# Patient Record
Sex: Male | Born: 1980 | Race: Black or African American | Hispanic: No | Marital: Single | Smoking: Current some day smoker
Health system: Southern US, Community
[De-identification: ages and names within clinical notes are randomized; demographics above are authoritative.]

## PROBLEM LIST (undated history)

## (undated) DIAGNOSIS — I10 Essential (primary) hypertension: Secondary | ICD-10-CM

## (undated) HISTORY — PX: TONSILLECTOMY: SUR1361

---
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
## 2002-03-21 ENCOUNTER — Encounter: Payer: Self-pay | Admitting: Emergency Medicine

## 2002-03-21 ENCOUNTER — Emergency Department (HOSPITAL_COMMUNITY): Admission: EM | Admit: 2002-03-21 | Discharge: 2002-03-21 | Payer: Self-pay | Admitting: Emergency Medicine

## 2004-03-31 ENCOUNTER — Emergency Department (HOSPITAL_COMMUNITY): Admission: EM | Admit: 2004-03-31 | Discharge: 2004-03-31 | Payer: Self-pay | Admitting: Emergency Medicine

## 2009-01-07 ENCOUNTER — Emergency Department (HOSPITAL_COMMUNITY): Admission: EM | Admit: 2009-01-07 | Discharge: 2009-01-07 | Payer: Self-pay | Admitting: Emergency Medicine

## 2009-04-13 ENCOUNTER — Emergency Department (HOSPITAL_COMMUNITY): Admission: EM | Admit: 2009-04-13 | Discharge: 2009-04-13 | Payer: Self-pay | Admitting: Emergency Medicine

## 2009-05-21 ENCOUNTER — Emergency Department (HOSPITAL_COMMUNITY): Admission: EM | Admit: 2009-05-21 | Discharge: 2009-05-21 | Payer: Self-pay | Admitting: Internal Medicine

## 2009-07-11 ENCOUNTER — Emergency Department (HOSPITAL_COMMUNITY): Admission: EM | Admit: 2009-07-11 | Discharge: 2009-07-11 | Payer: Self-pay | Admitting: Emergency Medicine

## 2009-08-29 ENCOUNTER — Emergency Department (HOSPITAL_COMMUNITY): Admission: EM | Admit: 2009-08-29 | Discharge: 2009-08-29 | Payer: Self-pay | Admitting: Emergency Medicine

## 2009-12-10 ENCOUNTER — Emergency Department (HOSPITAL_COMMUNITY): Admission: EM | Admit: 2009-12-10 | Discharge: 2009-12-10 | Payer: Self-pay | Admitting: Emergency Medicine

## 2010-02-06 ENCOUNTER — Emergency Department (HOSPITAL_COMMUNITY): Admission: EM | Admit: 2010-02-06 | Discharge: 2010-02-06 | Payer: Self-pay | Admitting: Emergency Medicine

## 2010-02-10 ENCOUNTER — Emergency Department (HOSPITAL_COMMUNITY): Admission: EM | Admit: 2010-02-10 | Discharge: 2010-02-10 | Payer: Self-pay | Admitting: Emergency Medicine

## 2010-03-14 ENCOUNTER — Emergency Department (HOSPITAL_COMMUNITY): Admission: EM | Admit: 2010-03-14 | Discharge: 2010-03-15 | Payer: Self-pay | Admitting: Emergency Medicine

## 2010-03-15 ENCOUNTER — Inpatient Hospital Stay (HOSPITAL_COMMUNITY): Admission: AD | Admit: 2010-03-15 | Discharge: 2010-03-16 | Payer: Self-pay | Admitting: Psychiatry

## 2010-03-15 ENCOUNTER — Ambulatory Visit: Payer: Self-pay | Admitting: Psychiatry

## 2011-02-22 LAB — URINALYSIS, ROUTINE W REFLEX MICROSCOPIC
Bilirubin Urine: NEGATIVE
Glucose, UA: NEGATIVE mg/dL
Hgb urine dipstick: NEGATIVE
Ketones, ur: NEGATIVE mg/dL
Nitrite: NEGATIVE
Protein, ur: NEGATIVE mg/dL
Specific Gravity, Urine: 1.025 (ref 1.005–1.030)
Urobilinogen, UA: 1 mg/dL (ref 0.0–1.0)
pH: 8 (ref 5.0–8.0)

## 2011-02-25 LAB — BASIC METABOLIC PANEL
BUN: 5 mg/dL — ABNORMAL LOW (ref 6–23)
CO2: 22 mEq/L (ref 19–32)
Calcium: 9.7 mg/dL (ref 8.4–10.5)
Chloride: 104 mEq/L (ref 96–112)
Creatinine, Ser: 1.36 mg/dL (ref 0.4–1.5)
GFR calc Af Amer: >60 mL/min (ref >60)
GFR calc non Af Amer: >60 mL/min (ref >60)
Glucose, Bld: 107 mg/dL — ABNORMAL HIGH (ref 70–99)
Potassium: 3.4 mEq/L — ABNORMAL LOW (ref 3.5–5.1)
Sodium: 141 mEq/L (ref 135–145)

## 2011-02-25 LAB — CBC
HCT: 48.9 % (ref 39.0–52.0)
Hemoglobin: 17 g/dL (ref 13.0–17.0)
MCHC: 34.8 g/dL (ref 30.0–36.0)
MCV: 79.5 fL (ref 78.0–100.0)
Platelets: 294 10*3/uL (ref 150–400)
RBC: 6.15 MIL/uL — ABNORMAL HIGH (ref 4.22–5.81)
RDW: 13.5 % (ref 11.5–15.5)
WBC: 14.9 10*3/uL — ABNORMAL HIGH (ref 4.0–10.5)

## 2011-02-25 LAB — RAPID URINE DRUG SCREEN, HOSP PERFORMED
Amphetamines: NOT DETECTED
Barbiturates: NOT DETECTED
Benzodiazepines: NOT DETECTED
Cocaine: POSITIVE — AB
Opiates: NOT DETECTED
Tetrahydrocannabinol: POSITIVE — AB

## 2011-02-25 LAB — ETHANOL: Alcohol, Ethyl (B): 187 mg/dL — ABNORMAL HIGH (ref 0–10)

## 2011-02-25 LAB — DIFFERENTIAL
Basophils Absolute: 0 10*3/uL (ref 0.0–0.1)
Basophils Relative: 0 % (ref 0–1)
Eosinophils Relative: 0 % (ref 0–5)
Monocytes Absolute: 0.8 10*3/uL (ref 0.1–1.0)

## 2012-08-19 ENCOUNTER — Emergency Department (HOSPITAL_COMMUNITY)
Admission: EM | Admit: 2012-08-19 | Discharge: 2012-08-19 | Disposition: A | Discharge status: Home / Self Care | Payer: Self-pay | Source: Home / Self Care | Attending: Emergency Medicine | Admitting: Emergency Medicine

## 2012-08-19 ENCOUNTER — Encounter (HOSPITAL_COMMUNITY): Payer: Self-pay | Admitting: Emergency Medicine

## 2012-08-19 DIAGNOSIS — Z202 Contact with and (suspected) exposure to infections with a predominantly sexual mode of transmission: Secondary | ICD-10-CM

## 2012-08-19 DIAGNOSIS — Z2089 Contact with and (suspected) exposure to other communicable diseases: Secondary | ICD-10-CM

## 2012-08-19 MED ORDER — AZITHROMYCIN 1 G PO PACK
1.0000 g | PACK | Freq: Once | ORAL | Status: AC
  Administered 2012-08-19: 1 g via ORAL

## 2012-08-19 MED ORDER — LIDOCAINE HCL (PF) 1 % IJ SOLN
INTRAMUSCULAR | Status: AC
  Filled 2012-08-19: qty 5

## 2012-08-19 MED ORDER — METRONIDAZOLE 500 MG PO TABS: 2000.0000 mg | ORAL_TABLET | Freq: Once | ORAL | Status: AC

## 2012-08-19 MED ORDER — CEFTRIAXONE SODIUM 250 MG IJ SOLR
INTRAMUSCULAR | Status: AC
  Filled 2012-08-19: qty 250

## 2012-08-19 MED ORDER — CEFTRIAXONE SODIUM 250 MG IJ SOLR
250.0000 mg | Freq: Once | INTRAMUSCULAR | Status: AC
  Administered 2012-08-19: 250 mg via INTRAMUSCULAR

## 2012-08-19 MED ORDER — AZITHROMYCIN 250 MG PO TABS
ORAL_TABLET | ORAL | Status: AC
  Filled 2012-08-19: qty 4

## 2012-08-19 NOTE — ED Notes (Signed)
States girlfriend dx with gonorrhea c/o of yellow penile d/c x 3 days

## 2012-08-19 NOTE — ED Provider Notes (Signed)
History     CSN: [id number]  Arrival date & time 08/19/12  1722   First MD Initiated Contact with Patient 08/19/12 1723      No chief complaint on file.   (Consider location/radiation/quality/duration/timing/severity/associated sxs/prior treatment) The history is provided by the patient.   31 y.o. male complains of yellow penis discharge for 2 days.  Denies significant pelvic pain or fever. No UTI symptoms. Sexually active, sometimes uses condoms, new partner.  Last unprotected intercourse 4 days ago, condom broke.  Reports partner reported +gonorrhea after being tested, +chlamydia hx 4 years ago.    History reviewed. No pertinent past medical history.  History reviewed. No pertinent past surgical history.  No family history on file.  History  Substance Use Topics  . Smoking status: Not on file  . Smokeless tobacco: Not on file  . Alcohol Use: Not on file      Review of Systems  Constitutional: Negative.   Respiratory: Negative.   Cardiovascular: Negative.   Genitourinary: Positive for discharge. Negative for dysuria, flank pain, decreased urine volume, scrotal swelling, penile pain and testicular pain.    Allergies  Review of patient's allergies indicates no known allergies.  Home Medications   Current Outpatient Rx  Name Route Sig Dispense Refill  . METRONIDAZOLE 500 MG PO TABS Oral Take 4 tablets (2,000 mg total) by mouth once. 4 tablet 0    BP 116/79  Pulse 68  Temp 97.8 F (36.6 C) (Oral)  Resp 16  SpO2 99%  Physical Exam  Nursing note and vitals reviewed. Constitutional: He is oriented to person, place, and time. Vital signs are normal. He appears well-developed and well-nourished. He is active and cooperative.  HENT:  Head: Normocephalic.  Eyes: Conjunctivae normal are normal. Pupils are equal, round, and reactive to light. No scleral icterus.  Neck: Trachea normal. Neck supple.  Cardiovascular: Normal rate, regular rhythm and normal heart sounds.    Pulmonary/Chest: Effort normal and breath sounds normal.  Abdominal: Soft. Bowel sounds are normal. There is no tenderness.  Genitourinary: Testes normal and penis normal. Cremasteric reflex is present. Circumcised.  Lymphadenopathy:    He has no cervical adenopathy.    He has no axillary adenopathy.       Right: No inguinal adenopathy present.       Left: No inguinal adenopathy present.  Neurological: He is alert and oriented to person, place, and time. No cranial nerve deficit or sensory deficit.  Skin: Skin is warm and dry.  Psychiatric: He has a normal mood and affect. His speech is normal and behavior is normal. Judgment and thought content normal. Cognition and memory are normal.    ED Course  Procedures (including critical care time)  Labs Reviewed - No data to display No results found.   1. Exposure to STD       MDM  Sent off GC/chlamydia.  Will treat empirically now. Giving ceftriaxone 250 mg IM/azithro 1 gm po. Will send home with flagyl.  Advised pt to refrain from sexual contact until she he knows lab results, symptoms resolve, and partner(s) are treated. Pt provided working phone number. Pt agrees.          Johnsie Kindred, NP 08/20/12 2038

## 2012-08-20 LAB — GC/CHLAMYDIA PROBE AMP, GENITAL: Chlamydia, DNA Probe: POSITIVE — AB

## 2012-08-22 ENCOUNTER — Telehealth (HOSPITAL_COMMUNITY): Payer: Self-pay | Admitting: *Deleted

## 2012-08-22 NOTE — ED Notes (Signed)
GC neg., Chlamydia pos.  Pt. adequately treated with Zithromax. I called and left a message to call.  DHHS form completed and faxed to the [facility]. Vassie Moselle 08/22/2012

## 2012-08-24 NOTE — ED Provider Notes (Signed)
Medical screening examination/treatment/procedure(s) were performed by non-physician practitioner and as supervising physician I was immediately available for consultation/collaboration.  Leslee Home, M.D.   Reuben Likes, MD 08/24/12 [phone]

## 2012-08-25 ENCOUNTER — Telehealth (HOSPITAL_COMMUNITY): Payer: Self-pay | Admitting: *Deleted

## 2012-08-26 ENCOUNTER — Telehealth (HOSPITAL_COMMUNITY): Payer: Self-pay | Admitting: *Deleted

## 2012-08-26 NOTE — ED Notes (Signed)
I called home number and it is not in service. I called cell number and left a message.  Call 3.  Unable to contact pt. by phone.  Letter sent.

## 2012-10-10 ENCOUNTER — Emergency Department (HOSPITAL_COMMUNITY)
Admission: EM | Admit: 2012-10-10 | Discharge: 2012-10-10 | Discharge status: Against Medical Advice | Payer: Medicaid Other | Attending: Emergency Medicine | Admitting: Emergency Medicine

## 2012-10-10 ENCOUNTER — Encounter (HOSPITAL_COMMUNITY): Payer: Self-pay | Admitting: Emergency Medicine

## 2012-10-10 DIAGNOSIS — L0201 Cutaneous abscess of face: Secondary | ICD-10-CM | POA: Insufficient documentation

## 2012-10-10 DIAGNOSIS — L03211 Cellulitis of face: Secondary | ICD-10-CM | POA: Insufficient documentation

## 2012-10-10 DIAGNOSIS — F17200 Nicotine dependence, unspecified, uncomplicated: Secondary | ICD-10-CM | POA: Insufficient documentation

## 2012-10-10 MED ORDER — LIDOCAINE HCL 2 % IJ SOLN: 10.0000 mL | Freq: Once | INTRAMUSCULAR | Status: DC

## 2012-10-10 NOTE — ED Notes (Signed)
No response from the patient.

## 2012-10-10 NOTE — ED Notes (Signed)
Pt called x3 attempts - unable to locate pt.

## 2012-10-10 NOTE — ED Notes (Signed)
Pt presenting to ed with c/o abscess to forehead pt states sometimes I feel hot and sometimes I feel cold pt with redness and swelling noted to face

## 2012-10-11 ENCOUNTER — Emergency Department (HOSPITAL_COMMUNITY)
Admission: EM | Admit: 2012-10-11 | Discharge: 2012-10-11 | Disposition: A | Discharge status: Home / Self Care | Payer: Medicaid Other | Attending: Emergency Medicine | Admitting: Emergency Medicine

## 2012-10-11 ENCOUNTER — Encounter (HOSPITAL_COMMUNITY): Payer: Self-pay | Admitting: Emergency Medicine

## 2012-10-11 DIAGNOSIS — L0201 Cutaneous abscess of face: Secondary | ICD-10-CM | POA: Insufficient documentation

## 2012-10-11 DIAGNOSIS — F17200 Nicotine dependence, unspecified, uncomplicated: Secondary | ICD-10-CM | POA: Insufficient documentation

## 2012-10-11 DIAGNOSIS — L03211 Cellulitis of face: Secondary | ICD-10-CM | POA: Insufficient documentation

## 2012-10-11 MED ORDER — SULFAMETHOXAZOLE-TMP DS 800-160 MG PO TABS: 1.0000 | ORAL_TABLET | Freq: Two times a day (BID) | ORAL | Status: DC

## 2012-10-11 MED ORDER — SULFAMETHOXAZOLE-TMP DS 800-160 MG PO TABS
1.0000 | ORAL_TABLET | Freq: Once | ORAL | Status: AC
  Administered 2012-10-11: 1 via ORAL
  Filled 2012-10-11: qty 1

## 2012-10-11 NOTE — ED Notes (Signed)
The patient is AOx4 and comfortable with the discharge instructions. 

## 2012-10-11 NOTE — ED Notes (Signed)
Pt reports for about a week, having abscess to forehead; pt reports has gotten bigger over the time and is painful

## 2012-10-11 NOTE — ED Provider Notes (Signed)
Medical screening examination/treatment/procedure(s) were performed by non-physician practitioner and as supervising physician I was immediately available for consultation/collaboration.    Vida Roller, MD 10/11/12 [phone]

## 2012-10-11 NOTE — ED Provider Notes (Signed)
History     CSN: [id number]  Arrival date & time 10/11/12  0018   First MD Initiated Contact with Patient 10/11/12 0246      Chief Complaint  Patient presents with  . Abscess    (Consider location/radiation/quality/duration/timing/severity/associated sxs/prior treatment) HPI Comments: Patient has a small, firm, nodule between his eyebrows, causing pain to his nose, and swelling.  This is been going on for approximately one week.  It is tender to touch.  He denies any visual disturbances, fevers, myalgias  Patient is a 31 y.o. male presenting with abscess. The history is provided by the patient.  Abscess  This is a new problem. The current episode started less than one week ago. The problem occurs continuously. Pertinent negatives include no fever and no rhinorrhea.    History reviewed. No pertinent past medical history.  Past Surgical History  Procedure Date  . Tonsillectomy     History reviewed. No pertinent family history.  History  Substance Use Topics  . Smoking status: Current Some Day Smoker    Types: Cigarettes  . Smokeless tobacco: Not on file  . Alcohol Use: No      Review of Systems  Constitutional: Negative for fever and chills.  HENT: Negative for rhinorrhea.   Eyes: Negative for visual disturbance.  Skin: Negative for wound.  Neurological: Negative for weakness and headaches.    Allergies  Penicillins  Home Medications   Current Outpatient Rx  Name  Route  Sig  Dispense  Refill  . SULFAMETHOXAZOLE-TMP DS 800-160 MG PO TABS   Oral   Take 1 tablet by mouth 2 (two) times daily.   9 tablet   0     BP 162/90  Pulse 94  Temp 98.7 F (37.1 C) (Oral)  Resp 18  SpO2 97%  Physical Exam  Constitutional: He appears well-developed and well-nourished.  HENT:  Head: Normocephalic.  Eyes: Pupils are equal, round, and reactive to light.  Neck: Normal range of motion.  Pulmonary/Chest: Effort normal.  Musculoskeletal: Normal range of motion.    Neurological: He is alert.  Skin: Skin is warm. There is erythema.       ED Course  INCISION AND DRAINAGE Date/Time: 10/11/2012 3:11 AM Performed by: Arman Filter Authorized by: Arman Filter Consent: Verbal consent obtained. Risks and benefits: risks, benefits and alternatives were discussed Consent given by: patient Patient understanding: patient states understanding of the procedure being performed Required items: required blood products, implants, devices, and special equipment available Patient identity confirmed: verbally with patient Time out: Immediately prior to procedure a "time out" was called to verify the correct patient, procedure, equipment, support staff and site/side marked as required. Type: abscess Body area: head/neck Location details: face Anesthesia: local infiltration Local anesthetic: lidocaine 1% without epinephrine Scalpel size: 11 Needle gauge: 22 Incision type: single straight Complexity: simple Drainage: purulent Drainage amount: scant Wound treatment: wound left open Patient tolerance: Patient tolerated the procedure well with no immediate complications.   (including critical care time)  Labs Reviewed - No data to display No results found.   1. Facial abscess       MDM   Will start patient on Bactrim, as well as the I&D and warm compresses        Arman Filter, NP 10/11/12 [phone]

## 2012-10-11 NOTE — ED Notes (Signed)
Abscess to forehead between eyes; hard to touch; no drainage noted; denies any visual disturbances

## 2012-11-14 ENCOUNTER — Emergency Department (HOSPITAL_COMMUNITY)
Admission: EM | Admit: 2012-11-14 | Discharge: 2012-11-14 | Disposition: A | Discharge status: Home / Self Care | Payer: Medicaid Other | Attending: Emergency Medicine | Admitting: Emergency Medicine

## 2012-11-14 ENCOUNTER — Encounter (HOSPITAL_COMMUNITY): Payer: Self-pay | Admitting: Emergency Medicine

## 2012-11-14 DIAGNOSIS — A749 Chlamydial infection, unspecified: Secondary | ICD-10-CM | POA: Insufficient documentation

## 2012-11-14 DIAGNOSIS — A5400 Gonococcal infection of lower genitourinary tract, unspecified: Secondary | ICD-10-CM | POA: Insufficient documentation

## 2012-11-14 DIAGNOSIS — Z8619 Personal history of other infectious and parasitic diseases: Secondary | ICD-10-CM | POA: Insufficient documentation

## 2012-11-14 DIAGNOSIS — R369 Urethral discharge, unspecified: Secondary | ICD-10-CM

## 2012-11-14 DIAGNOSIS — F17200 Nicotine dependence, unspecified, uncomplicated: Secondary | ICD-10-CM | POA: Insufficient documentation

## 2012-11-14 DIAGNOSIS — A64 Unspecified sexually transmitted disease: Secondary | ICD-10-CM

## 2012-11-14 DIAGNOSIS — Z880 Allergy status to penicillin: Secondary | ICD-10-CM | POA: Insufficient documentation

## 2012-11-14 MED ORDER — AZITHROMYCIN 1 G PO PACK
1.0000 g | PACK | Freq: Once | ORAL | Status: AC
  Administered 2012-11-14: 1 g via ORAL
  Filled 2012-11-14: qty 1

## 2012-11-14 MED ORDER — CEFTRIAXONE SODIUM 250 MG IJ SOLR
250.0000 mg | Freq: Once | INTRAMUSCULAR | Status: AC
  Administered 2012-11-14: 250 mg via INTRAMUSCULAR
  Filled 2012-11-14: qty 250

## 2012-11-14 MED ORDER — DIPHENHYDRAMINE HCL 25 MG PO CAPS
25.0000 mg | ORAL_CAPSULE | Freq: Once | ORAL | Status: AC
  Administered 2012-11-14: 25 mg via ORAL
  Filled 2012-11-14: qty 1
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
## 2012-11-14 NOTE — ED Provider Notes (Signed)
History   This chart was scribed for Tommy Skene, MD by Tommy Bond, ED Scribe. This patient was seen in room TR11C/TR11C and the patient's care was started at 12:50PM.   CSN: [id number]  Arrival date & time 11/14/12  1216   None     No chief complaint on file.    The history is provided by the patient. No language interpreter was used.  Tommy Bond is a 31 y.o. male who presents to the Emergency Department complaining of exposure to gonorrhea 3 days ago. Pt reports having associated penile discharge, tingling while urinating it is mild. He denies any chest pain, SOB, cough, runny nose, testicular pain, abdominal pain, vomiting, diarrhea, nausea, muscle aches.    Pt has h/o chlamydia.  Pt is a current everyday smoker but denies alcohol use. History reviewed. No pertinent past medical history.  Past Surgical History  Procedure Date  . Tonsillectomy     No family history on file.  History  Substance Use Topics  . Smoking status: Current Some Day Smoker    Types: Cigarettes  . Smokeless tobacco: Not on file  . Alcohol Use: No      Review of Systems At least 10pt or greater review of systems completed and are negative except where specified in the HPI.  Allergies  Penicillins  Home Medications   Current Outpatient Rx  Name  Route  Sig  Dispense  Refill  . SULFAMETHOXAZOLE-TMP DS 800-160 MG PO TABS   Oral   Take 1 tablet by mouth 2 (two) times daily.   9 tablet   0     BP 143/91  Pulse 76  Temp 97.9 F (36.6 C) (Oral)  Resp 18  SpO2 99%  Physical Exam  Nursing notes reviewed.  Electronic medical record reviewed. VITAL SIGNS:   Filed Vitals:   11/14/12 1220  BP: 143/91  Pulse: 76  Temp: 97.9 F (36.6 C)  TempSrc: Oral  Resp: 18  SpO2: 99%   CONSTITUTIONAL: Awake, oriented, appears non-toxic HENT: Atraumatic, normocephalic, oral mucosa pink and moist, airway patent. Nares patent without drainage. External ears normal. EYES: Conjunctiva  clear, EOMI, PERRLA NECK: Trachea midline, non-tender, supple CARDIOVASCULAR: Normal heart rate, Normal rhythm, No murmurs, rubs, gallops PULMONARY/CHEST: Clear to auscultation, no rhonchi, wheezes, or rales. Symmetrical breath sounds. Non-tender. ABDOMINAL: Non-distended, soft, non-tender - no rebound or guarding.  BS normal. GU: Normal circumcised male, no lesions, no lymphadenopathy, no hernia present NEUROLOGIC: Non-focal, moving all four extremities, no gross sensory or motor deficits. EXTREMITIES: No clubbing, cyanosis, or edema SKIN: Warm, Dry, No erythema, No rash  ED Course  Procedures (including critical care time)  DIAGNOSTIC STUDIES: Oxygen Saturation is 99% on room air, normal by my interpretation.    COORDINATION OF CARE:  1:08 PM Discussed treatment plan which includes lab work with pt at bedside and pt agreed to plan.   Labs Reviewed - No data to display No results found.   1. Sexually transmitted infection   2. Penile discharge       MDM  Tommy Bond is a 31 y.o. male was exposed to [facility] and is complaining about urethral discharge. Treat patient ceftriaxone and azithromycin. Patient has a penicillin allergy listed for hives-given the low cross-reactivity of third-generation cephalosporins with penicillin, we'll give him the intramuscular injection of Rocephin with a 25 mg then a drill tablet. Patient will followup at the health department for further testing.   I personally performed the services described in this  documentation, which was scribed in my presence. The recorded information has been reviewed and is accurate. Tommy Bond, M.D.      Tommy Skene, MD 11/14/12 1645

## 2012-11-15 LAB — GC/CHLAMYDIA PROBE AMP: CT Probe RNA: POSITIVE — AB

## 2012-11-16 NOTE — ED Notes (Signed)
+   Gonorrhea +Chlamydia Patient treated with rocephin and Zithromax-DHHS letter faxed.
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
## 2014-02-03 ENCOUNTER — Emergency Department: Payer: Self-pay | Admitting: Emergency Medicine

## 2015-03-10 ENCOUNTER — Emergency Department (HOSPITAL_COMMUNITY)
Admission: EM | Admit: 2015-03-10 | Discharge: 2015-03-10 | Disposition: A | Discharge status: Home / Self Care | Payer: Medicaid Other | Attending: Emergency Medicine | Admitting: Emergency Medicine

## 2015-03-10 ENCOUNTER — Emergency Department (HOSPITAL_COMMUNITY): Payer: Medicaid Other

## 2015-03-10 ENCOUNTER — Encounter (HOSPITAL_COMMUNITY): Payer: Self-pay

## 2015-03-10 DIAGNOSIS — S2231XA Fracture of one rib, right side, initial encounter for closed fracture: Secondary | ICD-10-CM | POA: Diagnosis not present

## 2015-03-10 DIAGNOSIS — Y92410 Unspecified street and highway as the place of occurrence of the external cause: Secondary | ICD-10-CM | POA: Insufficient documentation

## 2015-03-10 DIAGNOSIS — S299XXA Unspecified injury of thorax, initial encounter: Secondary | ICD-10-CM | POA: Diagnosis present

## 2015-03-10 DIAGNOSIS — Y9389 Activity, other specified: Secondary | ICD-10-CM | POA: Diagnosis not present

## 2015-03-10 DIAGNOSIS — Y998 Other external cause status: Secondary | ICD-10-CM | POA: Diagnosis not present

## 2015-03-10 MED ORDER — TRAMADOL HCL 50 MG PO TABS: 50.0000 mg | ORAL_TABLET | Freq: Four times a day (QID) | ORAL | Status: DC | PRN

## 2015-03-10 MED ORDER — IBUPROFEN 800 MG PO TABS: 800.0000 mg | ORAL_TABLET | Freq: Three times a day (TID) | ORAL | Status: DC
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
## 2015-04-04 ENCOUNTER — Telehealth (HOSPITAL_BASED_OUTPATIENT_CLINIC_OR_DEPARTMENT_OTHER): Payer: Self-pay | Admitting: Emergency Medicine

## 2015-05-21 ENCOUNTER — Emergency Department (HOSPITAL_COMMUNITY): Payer: Medicaid Other

## 2015-05-21 ENCOUNTER — Emergency Department (HOSPITAL_COMMUNITY)
Admission: EM | Admit: 2015-05-21 | Discharge: 2015-05-21 | Disposition: A | Discharge status: Home / Self Care | Payer: Medicaid Other | Attending: Emergency Medicine | Admitting: Emergency Medicine

## 2015-05-21 ENCOUNTER — Encounter (HOSPITAL_COMMUNITY): Payer: Self-pay | Admitting: *Deleted

## 2015-05-21 DIAGNOSIS — I10 Essential (primary) hypertension: Secondary | ICD-10-CM | POA: Diagnosis not present

## 2015-05-21 DIAGNOSIS — Z880 Allergy status to penicillin: Secondary | ICD-10-CM | POA: Insufficient documentation

## 2015-05-21 DIAGNOSIS — X58XXXS Exposure to other specified factors, sequela: Secondary | ICD-10-CM | POA: Insufficient documentation

## 2015-05-21 DIAGNOSIS — M21822 Other specified acquired deformities of left upper arm: Secondary | ICD-10-CM

## 2015-05-21 DIAGNOSIS — M24412 Recurrent dislocation, left shoulder: Secondary | ICD-10-CM | POA: Insufficient documentation

## 2015-05-21 DIAGNOSIS — Z720 Tobacco use: Secondary | ICD-10-CM | POA: Diagnosis not present

## 2015-05-21 DIAGNOSIS — S43005S Unspecified dislocation of left shoulder joint, sequela: Secondary | ICD-10-CM | POA: Insufficient documentation

## 2015-05-21 DIAGNOSIS — S43492A Other sprain of left shoulder joint, initial encounter: Secondary | ICD-10-CM

## 2015-05-21 DIAGNOSIS — Z791 Long term (current) use of non-steroidal anti-inflammatories (NSAID): Secondary | ICD-10-CM | POA: Insufficient documentation

## 2015-05-21 DIAGNOSIS — S42302S Unspecified fracture of shaft of humerus, left arm, sequela: Secondary | ICD-10-CM | POA: Diagnosis not present

## 2015-05-21 DIAGNOSIS — M25512 Pain in left shoulder: Secondary | ICD-10-CM | POA: Diagnosis present

## 2015-05-21 HISTORY — DX: Essential (primary) hypertension: I10

## 2015-05-21 MED ORDER — OXYCODONE-ACETAMINOPHEN 5-325 MG PO TABS
1.0000 | ORAL_TABLET | Freq: Once | ORAL | Status: AC
  Administered 2015-05-21: 1 via ORAL
  Filled 2015-05-21: qty 1

## 2015-05-21 MED ORDER — OXYCODONE-ACETAMINOPHEN 5-325 MG PO TABS: 1.0000 | ORAL_TABLET | Freq: Once | ORAL | Status: DC
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
## 2015-05-30 ENCOUNTER — Emergency Department (HOSPITAL_COMMUNITY)
Admission: EM | Admit: 2015-05-30 | Discharge: 2015-05-30 | Disposition: A | Discharge status: Home / Self Care | Payer: Medicaid Other | Attending: Emergency Medicine | Admitting: Emergency Medicine

## 2015-05-30 ENCOUNTER — Encounter (HOSPITAL_COMMUNITY): Payer: Self-pay | Admitting: *Deleted

## 2015-05-30 DIAGNOSIS — I10 Essential (primary) hypertension: Secondary | ICD-10-CM | POA: Insufficient documentation

## 2015-05-30 DIAGNOSIS — G8911 Acute pain due to trauma: Secondary | ICD-10-CM | POA: Insufficient documentation

## 2015-05-30 DIAGNOSIS — Z880 Allergy status to penicillin: Secondary | ICD-10-CM | POA: Diagnosis not present

## 2015-05-30 DIAGNOSIS — M25512 Pain in left shoulder: Secondary | ICD-10-CM | POA: Diagnosis not present

## 2015-05-30 DIAGNOSIS — Z720 Tobacco use: Secondary | ICD-10-CM | POA: Insufficient documentation

## 2015-05-30 MED ORDER — CYCLOBENZAPRINE HCL 10 MG PO TABS: 10.0000 mg | ORAL_TABLET | Freq: Two times a day (BID) | ORAL | Status: DC | PRN

## 2015-05-30 MED ORDER — MELOXICAM 15 MG PO TABS: 15.0000 mg | ORAL_TABLET | Freq: Every day | ORAL | Status: DC

## 2015-05-30 NOTE — Discharge Instructions (Signed)
Take Mobic as needed for pain. Take Flexeril as needed for muscle spasm. You may take these medications together.

## 2015-05-30 NOTE — ED Provider Notes (Signed)
CSN: [id number]     Arrival date & time 05/30/15  0114 History   First MD Initiated Contact with Patient 05/30/15 0129     Chief Complaint  Patient presents with  . Shoulder Pain     (Consider location/radiation/quality/duration/timing/severity/associated sxs/prior Treatment) HPI Comments: Patient is a 34 year old male who presents with left shoulder pain from an injury that occurred 1.5 weeks ago. Patient reports continued pain from the incident and is out of his percocet. He is requesting a refill of pain medication. The pain is aching and severe without radiation. Movement and palpation makes the pain worse. No alleviating factors. No new injury. Patient will see an Orthopedist on July 1.    Past Medical History  Diagnosis Date  . Hypertension    Past Surgical History  Procedure Laterality Date  . Tonsillectomy     No family history on file. History  Substance Use Topics  . Smoking status: Current Some Day Smoker    Types: Cigarettes  . Smokeless tobacco: Not on file  . Alcohol Use: No    Review of Systems  Musculoskeletal: Positive for arthralgias.  All other systems reviewed and are negative.     Allergies  Penicillins  Home Medications   Prior to Admission medications   Medication Sig Start Date End Date Taking? Authorizing Provider  naproxen sodium (ANAPROX) 220 MG tablet Take 440-660 mg by mouth 2 (two) times daily as needed (for pain).    Historical Provider, MD  oxyCODONE-acetaminophen (PERCOCET/ROXICET) 5-325 MG per tablet Take 1-2 tablets by mouth once. 05/21/15   Marisa Severin, MD   BP 137/91 mmHg  Pulse 87  Temp(Src) 97.9 F (36.6 C) (Oral)  Resp 20  SpO2 100% Physical Exam  Constitutional: He is oriented to person, place, and time. He appears well-developed and well-nourished. No distress.  HENT:  Head: Normocephalic and atraumatic.  Eyes: Conjunctivae and EOM are normal.  Neck: Normal range of motion.  Cardiovascular: Normal rate and regular  rhythm.  Exam reveals no gallop and no friction rub.   No murmur heard. Pulmonary/Chest: Effort normal and breath sounds normal. He has no wheezes. He has no rales. He exhibits no tenderness.  Abdominal: Soft. He exhibits no distension. There is no tenderness.  Musculoskeletal:  Slightly limited ROM of left shoulder due to pain. No obvious deformity. Generalized tenderness to palpation. Patient has left arm in sling.   Neurological: He is alert and oriented to person, place, and time. Coordination normal.  Speech is goal-oriented. Moves limbs without ataxia.   Skin: Skin is warm and dry.  Psychiatric: He has a normal mood and affect. His behavior is normal.  Nursing note and vitals reviewed.   ED Course  Procedures (including critical care time) Labs Review Labs Reviewed - No data to display  Imaging Review No results found.   EKG Interpretation None      MDM   Final diagnoses:  Left shoulder pain    1:42 AM Patient reports continued pain from previous shoulder injury. Patient requesting medication refill. I will not refill Percocet but I will prescribe Mobic and Flexeril for pain. No new injury. No neurovascular compromise.   Patient has multiple warrants out for felony charges and will be discharged to jail.     Emilia Beck, PA-C 05/30/15 0147  Richardean Canal, MD 05/30/15 [phone]
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
## 2016-01-09 ENCOUNTER — Emergency Department (HOSPITAL_COMMUNITY)
Admission: EM | Admit: 2016-01-09 | Discharge: 2016-01-09 | Disposition: A | Discharge status: Home / Self Care | Payer: Medicaid Other | Attending: Emergency Medicine | Admitting: Emergency Medicine

## 2016-01-09 ENCOUNTER — Encounter (HOSPITAL_COMMUNITY): Payer: Self-pay

## 2016-01-09 DIAGNOSIS — A64 Unspecified sexually transmitted disease: Secondary | ICD-10-CM | POA: Diagnosis not present

## 2016-01-09 DIAGNOSIS — I10 Essential (primary) hypertension: Secondary | ICD-10-CM | POA: Insufficient documentation

## 2016-01-09 DIAGNOSIS — Z880 Allergy status to penicillin: Secondary | ICD-10-CM | POA: Diagnosis not present

## 2016-01-09 DIAGNOSIS — Z791 Long term (current) use of non-steroidal anti-inflammatories (NSAID): Secondary | ICD-10-CM | POA: Insufficient documentation

## 2016-01-09 DIAGNOSIS — F17210 Nicotine dependence, cigarettes, uncomplicated: Secondary | ICD-10-CM | POA: Diagnosis not present

## 2016-01-09 DIAGNOSIS — R369 Urethral discharge, unspecified: Secondary | ICD-10-CM | POA: Diagnosis present

## 2016-01-09 DIAGNOSIS — Z79899 Other long term (current) drug therapy: Secondary | ICD-10-CM | POA: Insufficient documentation

## 2016-01-09 LAB — URINE MICROSCOPIC-ADD ON

## 2016-01-09 LAB — URINALYSIS, ROUTINE W REFLEX MICROSCOPIC
Glucose, UA: NEGATIVE mg/dL
KETONES UR: NEGATIVE mg/dL
NITRITE: NEGATIVE
PH: 5 (ref 5.0–8.0)
PROTEIN: 30 mg/dL — AB
Specific Gravity, Urine: 1.03 (ref 1.005–1.030)

## 2016-01-09 LAB — GC/CHLAMYDIA PROBE AMP (~~LOC~~) NOT AT ARMC
Chlamydia: NEGATIVE
NEISSERIA GONORRHEA: NEGATIVE

## 2016-01-09 LAB — HIV ANTIBODY (ROUTINE TESTING W REFLEX): HIV SCREEN 4TH GENERATION: NONREACTIVE

## 2016-01-09 MED ORDER — LIDOCAINE HCL (PF) 1 % IJ SOLN
INTRAMUSCULAR | Status: AC
  Administered 2016-01-09: 0.9 mL
  Filled 2016-01-09: qty 5

## 2016-01-09 MED ORDER — CEFTRIAXONE SODIUM 250 MG IJ SOLR
250.0000 mg | Freq: Once | INTRAMUSCULAR | Status: AC
  Administered 2016-01-09: 250 mg via INTRAMUSCULAR
  Filled 2016-01-09: qty 250

## 2016-01-09 MED ORDER — AZITHROMYCIN 250 MG PO TABS
1000.0000 mg | ORAL_TABLET | Freq: Once | ORAL | Status: AC
  Administered 2016-01-09: 1000 mg via ORAL
  Filled 2016-01-09: qty 4

## 2016-01-09 NOTE — ED Provider Notes (Signed)
CSN: [id number]     Arrival date & time 01/09/16  0115 History  By signing my name below, I, Tommy Bond, attest that this documentation has been prepared under the direction and in the presence of Tommy Crumble, MD . Electronically Signed: Freida Bond, Scribe. 01/09/2016. 2:24 AM.      Chief Complaint  Patient presents with  . Penile Discharge    The history is provided by the patient. No language interpreter was used.   HPI Comments:  Tommy Bond is a 35 y.o. male who presents to the Emergency Department complaining of mild penile discharge x 1 day. He reports associated tingling to his genitals.No alleviating factors noted. Pt states his girlfriend informed him yesterday that she was diagnosed with gonorrhea; pt notes they last had unprotected sex ~ 1 week ago. He denies fever and chills.   Past Medical History  Diagnosis Date  . Hypertension    Past Surgical History  Procedure Laterality Date  . Tonsillectomy     No family history on file. Social History  Substance Use Topics  . Smoking status: Current Some Day Smoker    Types: Cigarettes  . Smokeless tobacco: None  . Alcohol Use: No    Review of Systems 10 systems reviewed and all are negative for acute change except as noted in the HPI.   Allergies  Penicillins  Home Medications   Prior to Admission medications   Medication Sig Start Date End Date Taking? Authorizing Provider  cyclobenzaprine (FLEXERIL) 10 MG tablet Take 1 tablet (10 mg total) by mouth 2 (two) times daily as needed for muscle spasms. 05/30/15   Kaitlyn Szekalski, PA-C  meloxicam (MOBIC) 15 MG tablet Take 1 tablet (15 mg total) by mouth daily. 05/30/15   Kaitlyn Szekalski, PA-C  naproxen sodium (ANAPROX) 220 MG tablet Take 440-660 mg by mouth 2 (two) times daily as needed (for pain).    Historical Provider, MD  oxyCODONE-acetaminophen (PERCOCET/ROXICET) 5-325 MG per tablet Take 1-2 tablets by mouth once. 05/21/15   Marisa Severin, MD   BP 152/90 mmHg   Pulse 107  Temp(Src) 98 F (36.7 C) (Oral)  Resp 14  Ht 6' (1.829 m)  Wt 185 lb (83.915 kg)  BMI 25.08 kg/m2  SpO2 97% Physical Exam  Constitutional: He is oriented to person, place, and time. Vital signs are normal. He appears well-developed and well-nourished.  Non-toxic appearance. He does not appear ill. No distress.  HENT:  Head: Normocephalic and atraumatic.  Nose: Nose normal.  Mouth/Throat: Oropharynx is clear and moist. No oropharyngeal exudate.  Eyes: Conjunctivae and EOM are normal. Pupils are equal, round, and reactive to light. No scleral icterus.  Neck: Normal range of motion. Neck supple. No tracheal deviation, no edema, no erythema and normal range of motion present. No thyroid mass and no thyromegaly present.  Cardiovascular: Normal rate, regular rhythm, S1 normal, S2 normal, normal heart sounds, intact distal pulses and normal pulses.  Exam reveals no gallop and no friction rub.   No murmur heard. Pulmonary/Chest: Effort normal and breath sounds normal. No respiratory distress. He has no wheezes. He has no rhonchi. He has no rales.  Abdominal: Soft. Normal appearance and bowel sounds are normal. He exhibits no distension, no ascites and no mass. There is no hepatosplenomegaly. There is no tenderness. There is no rebound, no guarding and no CVA tenderness.  Genitourinary: Penis normal. Right testis shows no swelling and no tenderness. Left testis shows no swelling and no tenderness. Circumcised.  Musculoskeletal:  Normal range of motion. He exhibits no edema or tenderness.  Lymphadenopathy:    He has no cervical adenopathy.  Neurological: He is alert and oriented to person, place, and time. He has normal strength. No cranial nerve deficit or sensory deficit.  Skin: Skin is warm, dry and intact. No petechiae and no rash noted. He is not diaphoretic. No erythema. No pallor.  Psychiatric: He has a normal mood and affect. His behavior is normal. Judgment normal.  Nursing note  and vitals reviewed.   ED Course  Procedures   DIAGNOSTIC STUDIES:  Oxygen Saturation is 97% on RA, normal by my interpretation.    COORDINATION OF CARE:  2:10 AM Will order GC/Chl, and HIV test. Discussed treatment plan with pt at bedside which includes administration of Rocephin and Zithromax in the ED and pt agreed to plan.  Labs Review Labs Reviewed  URINALYSIS, ROUTINE W REFLEX MICROSCOPIC (NOT AT A [facility]) - Abnormal; Notable for the following:    Color, Urine AMBER (*)    APPearance CLOUDY (*)    Hgb urine dipstick LARGE (*)    Bilirubin Urine SMALL (*)    Protein, ur 30 (*)    Leukocytes, UA SMALL (*)    All other components within normal limits  URINE MICROSCOPIC-ADD ON - Abnormal; Notable for the following:    Squamous Epithelial / LPF 0-5 (*)    Bacteria, UA RARE (*)    Casts HYALINE CASTS (*)    All other components within normal limits  HIV ANTIBODY (ROUTINE TESTING)  GC/CHLAMYDIA PROBE AMP ([place]) NOT AT [facility]    Imaging Review No results found. I have personally reviewed and evaluated these images and lab results as part of my medical decision-making.   EKG Interpretation None      MDM   Final diagnoses:  None    Patient presents to the ED for dysuria and burning.  He had interaction with an STD+ patient.  He was treated with ceftriaxone and azithromycin.  He appears well and in NAD.  PCP fu advised.  VS remain within his normal limits and he is safe for DC.  I personally performed the services described in this documentation, which was scribed in my presence. The recorded information has been reviewed and is accurate.      Tommy Crumble, MD 01/09/16 [phone]

## 2016-01-09 NOTE — Discharge Instructions (Signed)
Sexually Transmitted Disease Tommy Bond, you were treated for STD's.  Do not have sex for 1 week while this gets treated.  See a primary care doctor within 3 days for close follow up.  If symptoms worsen, come back to the ED immediately.  Thank you. A sexually transmitted disease (STD) is a disease or infection often passed to another person during sex. However, STDs can be passed through nonsexual ways. An STD can be passed through:  Spit (saliva).  Semen.  Blood.  Mucus from the vagina.  Pee (urine). HOW CAN I LESSEN MY CHANCES OF GETTING AN STD?  Use:  Latex condoms.  Water-soluble lubricants with condoms. Do not use petroleum jelly or oils.  Dental dams. These are small pieces of latex that are used as a barrier during oral sex.  Avoid having more than one sex partner.  Do not have sex with someone who has other sex partners.  Do not have sex with anyone you do not know or who is at high risk for an STD.  Avoid risky sex that can break your skin.  Do not have sex if you have open sores on your mouth or skin.  Avoid drinking too much alcohol or taking illegal drugs. Alcohol and drugs can affect your good judgment.  Avoid oral and anal sex acts.  Get shots (vaccines) for HPV and hepatitis.  If you are at risk of being infected with HIV, it is advised that you take a certain medicine daily to prevent HIV infection. This is called pre-exposure prophylaxis (PrEP). You may be at risk if:  You are a man who has sex with other men (MSM).  You are attracted to the opposite sex (heterosexual) and are having sex with more than one partner.  You take drugs with a needle.  You have sex with someone who has HIV.  Talk with your doctor about if you are at high risk of being infected with HIV. If you begin to take PrEP, get tested for HIV first. Get tested every 3 months for as long as you are taking PrEP.  Get tested for STDs every year if you are sexually active. If you are  treated for an STD, get tested again 3 months after you are treated. WHAT SHOULD I DO IF I THINK I HAVE AN STD?  See your doctor.  Tell your sex partner(s) that you have an STD. They should be tested and treated.  Do not have sex until your doctor says it is okay. WHEN SHOULD I GET HELP? Get help right away if:  You have bad belly (abdominal) pain.  You are a man and have puffiness (swelling) or pain in your testicles.  You are a woman and have puffiness in your vagina.   This information is not intended to replace advice given to you by your health care provider. Make sure you discuss any questions you have with your health care provider.   Document Released: 12/31/2004 Document Revised: 12/14/2014 Document Reviewed: 05/19/2013 Elsevier Interactive Patient Education [employer]

## 2016-01-09 NOTE — ED Notes (Signed)
Pt was having sex with GF and she cheated on him. Has been having painful urination the past few days, this morning he noticed discharge. GF told him she had gonorrhea.

## 2018-04-01 ENCOUNTER — Emergency Department (HOSPITAL_BASED_OUTPATIENT_CLINIC_OR_DEPARTMENT_OTHER)
Admission: EM | Admit: 2018-04-01 | Discharge: 2018-04-01 | Disposition: A | Discharge status: Home / Self Care | Payer: Self-pay | Attending: Emergency Medicine | Admitting: Emergency Medicine

## 2018-04-01 ENCOUNTER — Encounter (HOSPITAL_BASED_OUTPATIENT_CLINIC_OR_DEPARTMENT_OTHER): Payer: Self-pay

## 2018-04-01 ENCOUNTER — Other Ambulatory Visit: Payer: Self-pay

## 2018-04-01 DIAGNOSIS — I10 Essential (primary) hypertension: Secondary | ICD-10-CM | POA: Insufficient documentation

## 2018-04-01 DIAGNOSIS — R369 Urethral discharge, unspecified: Secondary | ICD-10-CM | POA: Insufficient documentation

## 2018-04-01 DIAGNOSIS — Z202 Contact with and (suspected) exposure to infections with a predominantly sexual mode of transmission: Secondary | ICD-10-CM

## 2018-04-01 DIAGNOSIS — F17210 Nicotine dependence, cigarettes, uncomplicated: Secondary | ICD-10-CM | POA: Insufficient documentation

## 2018-04-01 MED ORDER — CEFTRIAXONE SODIUM 250 MG IJ SOLR
250.0000 mg | Freq: Once | INTRAMUSCULAR | Status: AC
  Administered 2018-04-01: 250 mg via INTRAMUSCULAR
  Filled 2018-04-01: qty 250

## 2018-04-01 MED ORDER — AZITHROMYCIN 250 MG PO TABS
1000.0000 mg | ORAL_TABLET | Freq: Once | ORAL | Status: AC
  Administered 2018-04-01: 1000 mg via ORAL
  Filled 2018-04-01: qty 4

## 2018-04-01 NOTE — ED Provider Notes (Signed)
Emergency Department Provider Note   I have reviewed the triage vital signs and the nursing notes.   HISTORY  Chief Complaint Exposure to STD   HPI Tommy Bond is a 37 y.o. male with PMH HTN presents to the emergency department for evaluation after STD exposure.  Patient states that he had unprotected sex with an individual who later tested positive for chlamydia.  Patient states that he has had some mild discharge but no dysuria.  No fevers or chills.  No nausea, vomiting, diarrhea.  No abdominal pain.  Past Medical History:  Diagnosis Date  . Hypertension     There are no active problems to display for this patient.   Past Surgical History:  Procedure Laterality Date  . TONSILLECTOMY      Current Outpatient Rx  . Order #: [id number] Class: Print  . Order #: [id number] Class: Print  . Order #: [id number] Class: Historical Med  . Order #: [id number] Class: Print    Allergies Penicillins  No family history on file.  Social History Social History   Tobacco Use  . Smoking status: Current Some Day Smoker    Types: Cigarettes  . Smokeless tobacco: Never Used  Substance Use Topics  . Alcohol use: Yes    Comment: occ  . Drug use: Yes    Types: Marijuana    Review of Systems  Constitutional: No fever/chills Eyes: No visual changes. ENT: No sore throat. Cardiovascular: Denies chest pain. Respiratory: Denies shortness of breath. Gastrointestinal: No abdominal pain.  No nausea, no vomiting.  No diarrhea.  No constipation. Genitourinary: Negative for dysuria. Mild yellow discharge today. Positive STD exposure.  Musculoskeletal: Negative for back pain. Skin: Negative for rash. Neurological: Negative for headaches, focal weakness or numbness.  10-point ROS otherwise negative.  ____________________________________________   PHYSICAL EXAM:  VITAL SIGNS: ED Triage Vitals  Enc Vitals Group     BP 04/01/18 2023 (!) 157/113     Pulse Rate 04/01/18 2023 97   Resp 04/01/18 2023 18     Temp 04/01/18 2023 98.2 F (36.8 C)     Temp Source 04/01/18 2023 Oral     SpO2 04/01/18 2023 98 %     Weight 04/01/18 2023 191 lb 9.3 oz (86.9 kg)     Height 04/01/18 2023 6' (1.829 m)     Pain Score 04/01/18 2022 0   Constitutional: Alert and oriented. Well appearing and in no acute distress. Eyes: Conjunctivae are normal. Head: Atraumatic. Nose: No congestion/rhinnorhea. Mouth/Throat: Mucous membranes are moist.   Neck: No stridor.  Respiratory: Normal respiratory effort.  Gastrointestinal:  No distention.  Musculoskeletal:  No gross deformities of extremities. Neurologic:  No gross focal neurologic deficits are appreciated.  Skin:  Skin is warm, dry and intact. No rash noted.  ____________________________________________   LABS (all labs ordered are listed, but only abnormal results are displayed)  Labs Reviewed  GC/CHLAMYDIA PROBE AMP ([place]) NOT AT [facility]   ____________________________________________   PROCEDURES  Procedure(s) performed:   Procedures  None ____________________________________________   INITIAL IMPRESSION / ASSESSMENT AND PLAN / ED COURSE  Pertinent labs & imaging results that were available during my care of the patient were reviewed by me and considered in my medical decision making (see chart for details).  Patient presents to the emergency department for evaluation after STD exposure.  Largely unremarkable exam.  Will send dirty urine for gonorrhea/chlamydia testing.  Patient has tolerated Rocephin in the past for STDs without reaction.  Patient given return precautions  and counseling on possibility of reinfection if partners are not also treated.   At this time, I do not feel there is any life-threatening condition present. I have reviewed and discussed all results (EKG, imaging, lab, urine as appropriate), exam findings with patient. I have reviewed nursing notes and appropriate previous records.  I feel the  patient is safe to be discharged home without further emergent workup. Discussed usual and customary return precautions. Patient and family (if present) verbalize understanding and are comfortable with this plan.  Patient will follow-up with their primary care provider. If they do not have a primary care provider, information for follow-up has been provided to them. All questions have been answered.  ____________________________________________  FINAL CLINICAL IMPRESSION(S) / ED DIAGNOSES  Final diagnoses:  STD exposure     MEDICATIONS GIVEN DURING THIS VISIT:  Medications  cefTRIAXone (ROCEPHIN) injection 250 mg (has no administration in time range)  azithromycin (ZITHROMAX) tablet 1,000 mg (has no administration in time range)    Note:  This document was prepared using Dragon voice recognition software and may include unintentional dictation errors.  Alona Bene, MD Emergency Medicine    Usher Hedberg, Arlyss Repress, MD 04/02/18 [phone]

## 2018-04-01 NOTE — ED Notes (Signed)
Pt verbalizes understanding of d/c instructions and denies any further need at this time. 

## 2018-04-01 NOTE — Discharge Instructions (Signed)

## 2018-04-01 NOTE — ED Triage Notes (Signed)
Pt reports known STD exposure-penile d/c x today-NAD-steady gait

## 2018-04-04 LAB — GC/CHLAMYDIA PROBE AMP (~~LOC~~) NOT AT ARMC
CHLAMYDIA, DNA PROBE: NEGATIVE
Neisseria Gonorrhea: POSITIVE — AB

## 2019-01-17 ENCOUNTER — Emergency Department (HOSPITAL_COMMUNITY)
Admission: EM | Admit: 2019-01-17 | Discharge: 2019-01-17 | Disposition: A | Discharge status: Home / Self Care | Payer: Medicaid Other | Attending: Emergency Medicine | Admitting: Emergency Medicine

## 2019-01-17 ENCOUNTER — Other Ambulatory Visit: Payer: Self-pay

## 2019-01-17 DIAGNOSIS — Y939 Activity, unspecified: Secondary | ICD-10-CM | POA: Diagnosis not present

## 2019-01-17 DIAGNOSIS — Z79899 Other long term (current) drug therapy: Secondary | ICD-10-CM | POA: Insufficient documentation

## 2019-01-17 DIAGNOSIS — Y999 Unspecified external cause status: Secondary | ICD-10-CM | POA: Diagnosis not present

## 2019-01-17 DIAGNOSIS — Y929 Unspecified place or not applicable: Secondary | ICD-10-CM | POA: Diagnosis not present

## 2019-01-17 DIAGNOSIS — S0101XA Laceration without foreign body of scalp, initial encounter: Secondary | ICD-10-CM | POA: Diagnosis not present

## 2019-01-17 DIAGNOSIS — F17210 Nicotine dependence, cigarettes, uncomplicated: Secondary | ICD-10-CM | POA: Insufficient documentation

## 2019-01-17 DIAGNOSIS — S0990XA Unspecified injury of head, initial encounter: Secondary | ICD-10-CM | POA: Diagnosis present

## 2019-01-17 DIAGNOSIS — I10 Essential (primary) hypertension: Secondary | ICD-10-CM | POA: Insufficient documentation
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
## 2019-01-17 NOTE — ED Notes (Signed)
Bed: [facility] Expected date:  Expected time:  Means of arrival:  Comments: 

## 2019-01-17 NOTE — Discharge Instructions (Signed)
Return for redness, increasing pain, fever.

## 2019-01-17 NOTE — ED Notes (Signed)
Pt assessed and discharged by [facility] 

## 2019-04-15 ENCOUNTER — Encounter (HOSPITAL_COMMUNITY): Payer: Self-pay | Admitting: Emergency Medicine

## 2019-04-15 ENCOUNTER — Other Ambulatory Visit: Payer: Self-pay

## 2019-04-15 ENCOUNTER — Emergency Department (HOSPITAL_COMMUNITY)
Admission: EM | Admit: 2019-04-15 | Discharge: 2019-04-15 | Disposition: A | Discharge status: Home / Self Care | Payer: Medicaid Other | Attending: Emergency Medicine | Admitting: Emergency Medicine

## 2019-04-15 DIAGNOSIS — Y9389 Activity, other specified: Secondary | ICD-10-CM | POA: Diagnosis not present

## 2019-04-15 DIAGNOSIS — M25562 Pain in left knee: Secondary | ICD-10-CM | POA: Insufficient documentation

## 2019-04-15 DIAGNOSIS — R0781 Pleurodynia: Secondary | ICD-10-CM | POA: Diagnosis not present

## 2019-04-15 DIAGNOSIS — S01511A Laceration without foreign body of lip, initial encounter: Secondary | ICD-10-CM | POA: Diagnosis not present

## 2019-04-15 DIAGNOSIS — I10 Essential (primary) hypertension: Secondary | ICD-10-CM | POA: Diagnosis not present

## 2019-04-15 DIAGNOSIS — Z79899 Other long term (current) drug therapy: Secondary | ICD-10-CM | POA: Diagnosis not present

## 2019-04-15 DIAGNOSIS — F17210 Nicotine dependence, cigarettes, uncomplicated: Secondary | ICD-10-CM | POA: Diagnosis not present

## 2019-04-15 DIAGNOSIS — S50812A Abrasion of left forearm, initial encounter: Secondary | ICD-10-CM | POA: Insufficient documentation

## 2019-04-15 DIAGNOSIS — Y92410 Unspecified street and highway as the place of occurrence of the external cause: Secondary | ICD-10-CM | POA: Insufficient documentation

## 2019-04-15 DIAGNOSIS — S0990XA Unspecified injury of head, initial encounter: Secondary | ICD-10-CM | POA: Diagnosis not present

## 2019-04-15 DIAGNOSIS — Y999 Unspecified external cause status: Secondary | ICD-10-CM | POA: Insufficient documentation

## 2019-04-15 DIAGNOSIS — S060X0A Concussion without loss of consciousness, initial encounter: Secondary | ICD-10-CM | POA: Insufficient documentation

## 2019-04-15 MED ORDER — IBUPROFEN 600 MG PO TABS: 600.0000 mg | ORAL_TABLET | Freq: Four times a day (QID) | ORAL | 0 refills | Status: DC | PRN

## 2019-04-15 MED ORDER — CYCLOBENZAPRINE HCL 10 MG PO TABS: 10.0000 mg | ORAL_TABLET | Freq: Two times a day (BID) | ORAL | 0 refills | Status: DC | PRN
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
## 2019-10-24 ENCOUNTER — Emergency Department (HOSPITAL_BASED_OUTPATIENT_CLINIC_OR_DEPARTMENT_OTHER)
Admission: EM | Admit: 2019-10-24 | Discharge: 2019-10-24 | Disposition: A | Discharge status: Home / Self Care | Payer: Medicaid Other | Attending: Emergency Medicine | Admitting: Emergency Medicine

## 2019-10-24 ENCOUNTER — Other Ambulatory Visit: Payer: Self-pay

## 2019-10-24 ENCOUNTER — Encounter (HOSPITAL_BASED_OUTPATIENT_CLINIC_OR_DEPARTMENT_OTHER): Payer: Self-pay | Admitting: *Deleted

## 2019-10-24 DIAGNOSIS — I10 Essential (primary) hypertension: Secondary | ICD-10-CM | POA: Insufficient documentation

## 2019-10-24 DIAGNOSIS — Z202 Contact with and (suspected) exposure to infections with a predominantly sexual mode of transmission: Secondary | ICD-10-CM

## 2019-10-24 DIAGNOSIS — F17210 Nicotine dependence, cigarettes, uncomplicated: Secondary | ICD-10-CM | POA: Diagnosis not present

## 2019-10-24 MED ORDER — CEFTRIAXONE SODIUM 250 MG IJ SOLR
250.0000 mg | Freq: Once | INTRAMUSCULAR | Status: AC
  Administered 2019-10-24: 250 mg via INTRAMUSCULAR
  Filled 2019-10-24: qty 250

## 2019-10-24 MED ORDER — AZITHROMYCIN 1 G PO PACK
1.0000 g | PACK | Freq: Once | ORAL | Status: AC
  Administered 2019-10-24: 1 g via ORAL
  Filled 2019-10-24: qty 1

## 2019-10-24 NOTE — ED Provider Notes (Signed)
   [facility] Provider Note: Georgena Spurling, MD, FACEP  CSN: [id number] MRN: [id number] ARRIVAL: 10/24/19 at [place]: [place]  Exposure to STD   [place]  10/24/19 11:04 PM Tommy Bond is a 38 y.o. male who states he was recently exposed to STD, possibly gonorrhea.  He is now having some mild burning and tingling with urination which began today.  He denies urethral discharge or stains in his underwear.  He has a history of STDs in the past.  He denies any other symptoms.   Past Medical History:  Diagnosis Date  . Hypertension     Past Surgical History:  Procedure Laterality Date  . TONSILLECTOMY      History reviewed. No pertinent family history.  Social History   Tobacco Use  . Smoking status: Current Some Day Smoker    Types: Cigarettes  . Smokeless tobacco: Never Used  Substance Use Topics  . Alcohol use: Yes    Comment: occ  . Drug use: Yes    Types: Marijuana    Prior to Admission medications   Not on File    Allergies Penicillins   REVIEW OF SYSTEMS  Negative except as noted here or in the History of Present Illness.   PHYSICAL EXAMINATION  Initial Vital Signs Blood pressure (!) 148/94, pulse 80, temperature 98.2 F (36.8 C), temperature source Oral, resp. rate 18, height 6' (1.829 m), weight 86.2 kg, SpO2 100 %.  Examination General: Well-developed, well-nourished male in no acute distress; appearance consistent with age of record HENT: normocephalic; atraumatic Eyes: Normal appearance Neck: supple Heart: regular rate and rhythm Lungs: clear to auscultation bilaterally Abdomen: soft; nondistended; nontender; bowel sounds present GU: Tanner V male, circumcised; no urethral discharge Extremities: No deformity; full range of motion Neurologic: Awake, alert and oriented; motor function intact in all extremities and symmetric; no facial droop Skin: Warm and dry Psychiatric: Normal mood  and affect   RESULTS  Summary of this visit's results, reviewed and interpreted by myself:   EKG Interpretation  Date/Time:    Ventricular Rate:    PR Interval:    QRS Duration:   QT Interval:    QTC Calculation:   R Axis:     Text Interpretation:        Laboratory Studies: No results found for this or any previous visit (from the past 24 hour(s)). Imaging Studies: No results found.  ED COURSE and MDM  Nursing notes, initial and subsequent vitals signs, including pulse oximetry, reviewed and interpreted by myself.  Vitals:   10/24/19 2301  BP: (!) 148/94  Pulse: 80  Resp: 18  Temp: 98.2 F (36.8 C)  TempSrc: Oral  SpO2: 100%  Weight: 86.2 kg  Height: 6' (1.829 m)   Medications  cefTRIAXone (ROCEPHIN) injection 250 mg (has no administration in time range)  azithromycin (ZITHROMAX) powder 1 g (has no administration in time range)    We will treat for gonorrhea and chlamydia.  RPR and HIV test sent.  PROCEDURES  Procedures   ED DIAGNOSES     ICD-10-CM   1. Exposure to sexually transmitted disease (STD)  Z20.2        Kalea Perine, MD 10/24/19 2311

## 2019-10-24 NOTE — ED Triage Notes (Signed)
Exposure to STD, states burning with urination.

## 2019-10-26 LAB — HIV ANTIBODY (ROUTINE TESTING W REFLEX): HIV Screen 4th Generation wRfx: NONREACTIVE — AB

## 2019-10-26 LAB — RPR: RPR Ser Ql: NONREACTIVE

## 2019-11-07 ENCOUNTER — Other Ambulatory Visit: Payer: Self-pay

## 2019-11-07 ENCOUNTER — Emergency Department (HOSPITAL_COMMUNITY)
Admission: EM | Admit: 2019-11-07 | Discharge: 2019-11-07 | Discharge status: Against Medical Advice | Payer: Medicaid Other | Attending: Emergency Medicine | Admitting: Emergency Medicine

## 2019-11-07 ENCOUNTER — Encounter (HOSPITAL_COMMUNITY): Payer: Self-pay | Admitting: Emergency Medicine

## 2019-11-07 DIAGNOSIS — Z5321 Procedure and treatment not carried out due to patient leaving prior to being seen by health care provider: Secondary | ICD-10-CM | POA: Diagnosis not present

## 2019-11-07 DIAGNOSIS — W2209XA Striking against other stationary object, initial encounter: Secondary | ICD-10-CM | POA: Diagnosis not present

## 2019-11-07 DIAGNOSIS — S0101XA Laceration without foreign body of scalp, initial encounter: Secondary | ICD-10-CM | POA: Diagnosis present

## 2019-11-07 DIAGNOSIS — Y999 Unspecified external cause status: Secondary | ICD-10-CM | POA: Diagnosis not present

## 2019-11-07 DIAGNOSIS — Y939 Activity, unspecified: Secondary | ICD-10-CM | POA: Diagnosis not present

## 2019-11-07 DIAGNOSIS — Y929 Unspecified place or not applicable: Secondary | ICD-10-CM | POA: Insufficient documentation
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
## 2019-11-07 NOTE — ED Notes (Signed)
Patient refusing vital signs at this time.

## 2019-11-07 NOTE — ED Notes (Signed)
Patient leaving with [employer] in gown and socks.

## 2019-11-07 NOTE — ED Notes (Signed)
Patient states he is refusing all medical care at this time.

## 2020-01-05 ENCOUNTER — Encounter (HOSPITAL_COMMUNITY): Payer: Self-pay | Admitting: Emergency Medicine

## 2020-01-05 ENCOUNTER — Emergency Department (HOSPITAL_COMMUNITY)
Admission: EM | Admit: 2020-01-05 | Discharge: 2020-01-05 | Disposition: A | Discharge status: Home / Self Care | Payer: Medicaid Other | Attending: Emergency Medicine | Admitting: Emergency Medicine

## 2020-01-05 ENCOUNTER — Other Ambulatory Visit: Payer: Self-pay

## 2020-01-05 DIAGNOSIS — S21111A Laceration without foreign body of right front wall of thorax without penetration into thoracic cavity, initial encounter: Secondary | ICD-10-CM | POA: Insufficient documentation

## 2020-01-05 DIAGNOSIS — Y939 Activity, unspecified: Secondary | ICD-10-CM | POA: Insufficient documentation

## 2020-01-05 DIAGNOSIS — W269XXA Contact with unspecified sharp object(s), initial encounter: Secondary | ICD-10-CM | POA: Insufficient documentation

## 2020-01-05 DIAGNOSIS — Y999 Unspecified external cause status: Secondary | ICD-10-CM | POA: Diagnosis not present

## 2020-01-05 DIAGNOSIS — I10 Essential (primary) hypertension: Secondary | ICD-10-CM | POA: Insufficient documentation

## 2020-01-05 DIAGNOSIS — F17210 Nicotine dependence, cigarettes, uncomplicated: Secondary | ICD-10-CM | POA: Insufficient documentation

## 2020-01-05 DIAGNOSIS — Z23 Encounter for immunization: Secondary | ICD-10-CM | POA: Diagnosis not present

## 2020-01-05 DIAGNOSIS — Y929 Unspecified place or not applicable: Secondary | ICD-10-CM | POA: Diagnosis not present

## 2020-01-05 MED ORDER — LIDOCAINE-EPINEPHRINE (PF) 2 %-1:200000 IJ SOLN
INTRAMUSCULAR | Status: AC
  Administered 2020-01-05: 20 mL
  Filled 2020-01-05: qty 20

## 2020-01-05 MED ORDER — TETANUS-DIPHTH-ACELL PERTUSSIS 5-2.5-18.5 LF-MCG/0.5 IM SUSP
INTRAMUSCULAR | Status: AC
  Administered 2020-01-05: 0.5 mL via INTRAMUSCULAR
  Filled 2020-01-05: qty 0.5

## 2020-01-05 MED ORDER — LIDOCAINE HCL 1 % IJ SOLN
INTRAMUSCULAR | Status: AC
  Filled 2020-01-05: qty 20

## 2020-01-05 NOTE — Discharge Instructions (Signed)
Follow sutured wound care instructions.   Sutures will need to be removed in 7-10 days. Return sooner for recheck with any sign of infection - fever, increasing pain or redness, or drainage from the wound.

## 2020-01-05 NOTE — ED Notes (Addendum)
Lidocaine and suture cart at bedside at bedside
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
## 2020-01-05 NOTE — ED Triage Notes (Signed)
Pt brought to [facility] for 1 cm laceration to right upper abd. Unknown source and Left hand palm laceration  1 cm long both sites bleeding controlled.

## 2020-02-05 DEATH — deceased
